# Patient Record
Sex: Male | Born: 1995 | Race: Black or African American | Hispanic: No | Marital: Single | Smoking: Never smoker
Health system: Southern US, Community
[De-identification: ages and names within clinical notes are randomized; demographics above are authoritative.]

## PROBLEM LIST (undated history)

## (undated) DIAGNOSIS — J45909 Unspecified asthma, uncomplicated: Secondary | ICD-10-CM

## (undated) DIAGNOSIS — L309 Dermatitis, unspecified: Secondary | ICD-10-CM

## (undated) HISTORY — DX: Dermatitis, unspecified: L30.9

## (undated) HISTORY — DX: Unspecified asthma, uncomplicated: J45.909

---
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
## 2017-06-11 ENCOUNTER — Emergency Department (HOSPITAL_BASED_OUTPATIENT_CLINIC_OR_DEPARTMENT_OTHER)
Admission: EM | Admit: 2017-06-11 | Discharge: 2017-06-12 | Disposition: A | Discharge status: Home / Self Care | Payer: Medicaid Other | Attending: Emergency Medicine | Admitting: Emergency Medicine

## 2017-06-11 ENCOUNTER — Encounter (HOSPITAL_BASED_OUTPATIENT_CLINIC_OR_DEPARTMENT_OTHER): Payer: Self-pay | Admitting: Emergency Medicine

## 2017-06-11 ENCOUNTER — Emergency Department (HOSPITAL_BASED_OUTPATIENT_CLINIC_OR_DEPARTMENT_OTHER): Payer: Medicaid Other

## 2017-06-11 DIAGNOSIS — R0789 Other chest pain: Secondary | ICD-10-CM | POA: Insufficient documentation

## 2017-06-11 DIAGNOSIS — R079 Chest pain, unspecified: Secondary | ICD-10-CM | POA: Diagnosis present

## 2017-06-11 MED ORDER — KETOROLAC TROMETHAMINE 15 MG/ML IJ SOLN
15.0000 mg | Freq: Once | INTRAMUSCULAR | Status: AC
  Administered 2017-06-11: 15 mg via INTRAMUSCULAR
  Filled 2017-06-11: qty 1

## 2017-06-11 MED ORDER — IPRATROPIUM-ALBUTEROL 0.5-2.5 (3) MG/3ML IN SOLN
3.0000 mL | RESPIRATORY_TRACT | Status: DC
  Administered 2017-06-11: 3 mL via RESPIRATORY_TRACT

## 2017-06-11 NOTE — ED Notes (Signed)
Alert, NAD, calm, interactive, resps e/u, speaking in clear complete sentences, no dyspnea noted, skin W&D, VSS, c/o L lower chest wall pain, worse with palpation, and movement, onset after doing push-ups, better at rest lying down, (denies: sob, NVD, syncope, dizziness or visual changes). EDP at [facility]. Family at [facility]

## 2017-06-11 NOTE — ED Triage Notes (Signed)
PT presents with c/o chest pain bilateral lateral for 2 weeks.

## 2017-06-11 NOTE — ED Provider Notes (Signed)
[facility] Provider Note: Andrew Dell, MD, FACEP  CSN: [id number] MRN: [id number] ARRIVAL: 06/11/17 at 2211 ROOM: MH11/MH11   CHIEF COMPLAINT  Chest Pain   HISTORY OF PRESENT ILLNESS  06/11/17 11:13 PM Andrew David is a 21 y.o. male with two-week history of chest pain. The pain began after performing pushups. He has continued to exercise since then. He states the pain is located in the upper substernal region and is worse when he takes a deep breath. He is also having pain in his lower anterior ribs which is worse with palpation or movement. He rates his pain as a 10 out of 10. He is equivocal about being short of breath. He has not been coughing. He has not had a fever or cold symptoms.   History reviewed. No pertinent past medical history.  History reviewed. No pertinent surgical history.  No family history on file.  Social History  Substance Use Topics  . Smoking status: Never Smoker  . Smokeless tobacco: Not on file  . Alcohol use No    Prior to Admission medications   Medication Sig Start Date End Date Taking? Authorizing Provider  naproxen (NAPROSYN) 375 MG tablet Take 1 tablet (375 mg total) by mouth 2 (two) times daily as needed (for chest wall pain). 06/12/17   Lexxie Winberg, Jonny Ruiz, MD    Allergies Patient has no known allergies.   REVIEW OF SYSTEMS  Negative except as noted here or in the History of Present Illness.   PHYSICAL EXAMINATION  Initial Vital Signs Blood pressure (!) 143/91, pulse 88, temperature 99.1 F (37.3 C), temperature source Oral, resp. rate 15, height 5' 6.5" (1.689 m), SpO2 100 %.  Examination General: Well-developed, well-nourished male in no acute distress; appearance consistent with age of record HENT: normocephalic; atraumatic Eyes: pupils equal, round and reactive to light; extraocular muscles intact Neck: supple Heart: regular rate and rhythm Lungs: Decreased air movement bilaterally without wheezing Chest: Mild  bilateral lower anterior rib tenderness Abdomen: soft; nondistended; nontender; no masses or hepatosplenomegaly; bowel sounds present Extremities: No deformity; full range of motion; pulses normal Neurologic: Awake, alert and oriented; motor function intact in all extremities and symmetric; no facial droop Skin: Warm and dry Psychiatric: Normal mood and affect   RESULTS  Summary of this visit's results, reviewed by myself:   EKG Interpretation  Date/Time:  Sunday June 11 2017 22:22:19 EDT Ventricular Rate:  85 PR Interval:  160 QRS Duration: 86 QT Interval:  340 QTC Calculation: 404 R Axis:   32 Text Interpretation:  Normal sinus rhythm with sinus arrhythmia Normal ECG No previous ECGs available Confirmed by Paula Libra ([id number]) on 06/11/2017 10:37:25 PM      Laboratory Studies: No results found for this or any previous visit (from the past 24 hour(s)). Imaging Studies: Dg Chest 2 View  Result Date: 06/11/2017 CLINICAL DATA:  Chest pain for 2 weeks EXAM: CHEST  2 VIEW COMPARISON:  None. FINDINGS: The heart size and mediastinal contours are within normal limits. Both lungs are clear. The visualized skeletal structures are unremarkable. IMPRESSION: No active cardiopulmonary disease. Electronically Signed   By: Deatra Robinson M.D.   On: 06/11/2017 22:44    ED COURSE  Nursing notes and initial vitals signs, including pulse oximetry, reviewed.  Vitals:   06/11/17 2318 06/11/17 2319 06/11/17 2330 06/11/17 2338  BP: 127/87  129/73   Pulse: 79 79 77   Resp:  10 18   Temp:      TempSrc:  SpO2: 98% 97% 97% 98%  Height:       12:13 AM No significant change in air movement with DuoNeb treatment. Suspect decreased air sounds due to poor inspiratory effort because of chest wall pain. Pain improved with IM Toradol.  PROCEDURES    ED DIAGNOSES     ICD-10-CM   1. Chest wall pain R07.89        Paula Libra, MD 06/12/17 [phone]

## 2017-06-12 MED ORDER — NAPROXEN 375 MG PO TABS: 375.0000 mg | ORAL_TABLET | Freq: Two times a day (BID) | ORAL | 0 refills | Status: DC | PRN

## 2017-06-12 NOTE — ED Notes (Signed)
Dr. Molpus at BS 

## 2018-05-05 ENCOUNTER — Encounter (HOSPITAL_COMMUNITY): Payer: Self-pay | Admitting: Emergency Medicine

## 2018-05-05 ENCOUNTER — Other Ambulatory Visit: Payer: Self-pay

## 2018-05-05 ENCOUNTER — Emergency Department (HOSPITAL_COMMUNITY)
Admission: EM | Admit: 2018-05-05 | Discharge: 2018-05-05 | Disposition: A | Discharge status: Home / Self Care | Payer: Medicaid Other | Attending: Emergency Medicine | Admitting: Emergency Medicine

## 2018-05-05 DIAGNOSIS — M549 Dorsalgia, unspecified: Secondary | ICD-10-CM

## 2018-05-05 MED ORDER — IBUPROFEN 600 MG PO TABS: 600.0000 mg | ORAL_TABLET | Freq: Three times a day (TID) | ORAL | 0 refills | Status: DC | PRN

## 2018-05-05 MED ORDER — CYCLOBENZAPRINE HCL 10 MG PO TABS: 10.0000 mg | ORAL_TABLET | Freq: Two times a day (BID) | ORAL | 0 refills | Status: DC | PRN

## 2018-05-05 NOTE — ED Notes (Signed)
No answer for triage X 1.

## 2018-05-05 NOTE — ED Provider Notes (Signed)
[facility]   CSN: [id number] Arrival date & time: 05/05/18  1719     History   Chief Complaint Chief Complaint  Patient presents with  . Back Pain    HPI Andrew David is a 22 y.o. male.  The history is provided by the patient. No language interpreter was used.  Back Pain       22 year old male presenting for evaluation of back pain.  Patient mention he has had recurrent back pain ongoing for a year.  His mattress recently became too old and he got rid of it, states that his bed also broke and now he is sleeping directly on the floor.  Due to that, he endorsed worsening mid back pain after laying.  Pain is sharp and achy throbbing nonradiating.  He also admits to having to perform heavy lifting boxes at work which aggravates the pain.  Pain is moderate in severity.  No associated fever, chills, productive cough, shortness of breath, chest pain, abdominal pain, or rash.  He takes ibuprofen at home on occasion with some relief.  Denies any bowel bladder incontinence or saddle anesthesia.  History reviewed. No pertinent past medical history.  There are no active problems to display for this patient.   No past surgical history on file.      Home Medications    Prior to Admission medications   Medication Sig Start Date End Date Taking? Authorizing Provider  naproxen (NAPROSYN) 375 MG tablet Take 1 tablet (375 mg total) by mouth 2 (two) times daily as needed (for chest wall pain). 06/12/17   Molpus, John, MD    Family History No family history on file.  Social History Social History   Tobacco Use  . Smoking status: Never Smoker  Substance Use Topics  . Alcohol use: No  . Drug use: No     Allergies   Patient has no known allergies.   Review of Systems Review of Systems  Musculoskeletal: Positive for back pain.  All other systems reviewed and are negative.    Physical Exam Updated Vital Signs BP 127/72  (BP Location: Right Arm)   Pulse 87   Temp 98.6 F (37 C) (Oral)   Resp 18   SpO2 100%   Physical Exam  Constitutional: He is oriented to person, place, and time. He appears well-developed and well-nourished. No distress.  HENT:  Head: Atraumatic.  Eyes: Conjunctivae are normal.  Neck: Neck supple.  Cardiovascular: Normal rate and regular rhythm.  Pulmonary/Chest: Effort normal and breath sounds normal.  Abdominal: Soft. There is no tenderness.  Musculoskeletal: He exhibits tenderness (Mild diffuse tenderness to thoracic and parathoracic region on palpation without any overlying skin changes crepitus emphysema or rash.).  Neurological: He is alert and oriented to person, place, and time.  Skin: No rash noted.  Psychiatric: He has a normal mood and affect.  Nursing note and vitals reviewed.    ED Treatments / Results  Labs (all labs ordered are listed, but only abnormal results are displayed) Labs Reviewed - No data to display  EKG None  Radiology No results found.  Procedures Procedures (including critical care time)  Medications Ordered in ED Medications - No data to display   Initial Impression / Assessment and Plan / ED Course  I have reviewed the triage vital signs and the nursing notes.  Pertinent labs & imaging results that were available during my care of the patient were reviewed by me and considered in  my medical decision making (see chart for details).     BP 127/72 (BP Location: Right Arm)   Pulse 87   Temp 98.6 F (37 C) (Oral)   Resp 18   SpO2 100%    Final Clinical Impressions(s) / ED Diagnoses   Final diagnoses:  Mid back pain    ED Discharge Orders         Ordered    ibuprofen (ADVIL,MOTRIN) 600 MG tablet  Every 8 hours PRN     05/05/18 1948    cyclobenzaprine (FLEXERIL) 10 MG tablet  2 times daily PRN     05/05/18 1948         7:49 PM Patient here with reproducible muscle skeletal back pain without any red flags.  No evidence  to suggest infection.  Will provide symptomatic treatment, rice therapy discussed.  Stable for discharge.   Fayrene Helper, PA-C 05/05/18 1949    Lockie Mola, Adam, DO 05/06/18 1104

## 2018-05-05 NOTE — ED Notes (Signed)
Patient able to ambulate independently  

## 2018-05-05 NOTE — ED Notes (Signed)
Registration at bedside.

## 2018-05-05 NOTE — ED Triage Notes (Signed)
Pt states he has mid back pain, he has been sleeping on the floor because his mattress broke and the spring had cut him several times. He has had back pain since high school that has gotten worse with work, lifting at work, and sleeping on the floor. Ambulatory. No loss of bladder function.

## 2018-05-20 ENCOUNTER — Other Ambulatory Visit: Payer: Self-pay

## 2018-05-20 ENCOUNTER — Emergency Department (HOSPITAL_COMMUNITY)
Admission: EM | Admit: 2018-05-20 | Discharge: 2018-05-20 | Disposition: A | Discharge status: Home / Self Care | Payer: Medicaid Other | Attending: Emergency Medicine | Admitting: Emergency Medicine

## 2018-05-20 ENCOUNTER — Encounter (HOSPITAL_COMMUNITY): Payer: Self-pay | Admitting: Emergency Medicine

## 2018-05-20 DIAGNOSIS — L299 Pruritus, unspecified: Secondary | ICD-10-CM | POA: Diagnosis present

## 2018-05-20 DIAGNOSIS — B86 Scabies: Secondary | ICD-10-CM | POA: Insufficient documentation

## 2018-05-20 MED ORDER — PERMETHRIN 5 % EX CREA: TOPICAL_CREAM | CUTANEOUS | 1 refills | Status: DC

## 2018-05-20 NOTE — ED Triage Notes (Signed)
Pt reports having itching that started 2 days ago after being in a new place. Pt denies any new detergents, lotions, or soaps. Pt reports itching all over.

## 2018-05-20 NOTE — ED Provider Notes (Signed)
[facility]-EMERGENCY DEPT Provider Note   CSN: [id number] Arrival date & time: 05/20/18  0058     History   Chief Complaint Chief Complaint  Patient presents with  . Pruritis    HPI Andrew David is a 22 y.o. male.  Patient presents to the emergency department with a chief complaint of itching.  He states that he knows that itching 2 days ago and then it started on his spreading.  He reports that he has some small bites or bumps on his upper arms.  He states that he is staying in a new place.  He denies seeing any bedbugs.  Denies any treatments.  The history is provided by the patient. No language interpreter was used.    History reviewed. No pertinent past medical history.  There are no active problems to display for this patient.   History reviewed. No pertinent surgical history.      Home Medications    Prior to Admission medications   Medication Sig Start Date End Date Taking? Authorizing Provider  cyclobenzaprine (FLEXERIL) 10 MG tablet Take 1 tablet (10 mg total) by mouth 2 (two) times daily as needed for muscle spasms. 05/05/18   Fayrene Helper, PA-C  ibuprofen (ADVIL,MOTRIN) 600 MG tablet Take 1 tablet (600 mg total) by mouth every 8 (eight) hours as needed for moderate pain. 05/05/18   Fayrene Helper, PA-C  naproxen (NAPROSYN) 375 MG tablet Take 1 tablet (375 mg total) by mouth 2 (two) times daily as needed (for chest wall pain). 06/12/17   Molpus, Jonny Ruiz, MD  permethrin (ELIMITE) 5 % cream Apply to entire body other than face - let sit for 14 hours then wash off, may repeat in 1 week if still having symptoms 05/20/18   Roxy Horseman, PA-C    Family History History reviewed. No pertinent family history.  Social History Social History   Tobacco Use  . Smoking status: Never Smoker  . Smokeless tobacco: Never Used  Substance Use Topics  . Alcohol use: No  . Drug use: No     Allergies   Patient has no known allergies.   Review of  Systems Review of Systems  All other systems reviewed and are negative.    Physical Exam Updated Vital Signs BP 119/81 (BP Location: Left Arm)   Pulse 68   Temp (!) 97.3 F (36.3 C)   Ht 5' 7.5" (1.715 m)   Wt 86.2 kg   SpO2 100%   BMI 29.32 kg/m   Physical Exam  Constitutional: He is oriented to person, place, and time. He appears well-developed and well-nourished.  HENT:  Head: Normocephalic and atraumatic.  Eyes: Pupils are equal, round, and reactive to light. Conjunctivae and EOM are normal. Right eye exhibits no discharge. Left eye exhibits no discharge. No scleral icterus.  Neck: Normal range of motion. Neck supple. No JVD present.  Cardiovascular: Normal rate, regular rhythm and normal heart sounds. Exam reveals no gallop and no friction rub.  No murmur heard. Pulmonary/Chest: Effort normal and breath sounds normal. No respiratory distress. He has no wheezes. He has no rales. He exhibits no tenderness.  Abdominal: Soft. He exhibits no distension and no mass. There is no tenderness. There is no rebound and no guarding.  Musculoskeletal: Normal range of motion. He exhibits no edema or tenderness.  Neurological: He is alert and oriented to person, place, and time.  Skin: Skin is warm and dry.  Bites on extremities  Psychiatric: He has a normal mood  and affect. His behavior is normal. Judgment and thought content normal.  Nursing note and vitals reviewed.    ED Treatments / Results  Labs (all labs ordered are listed, but only abnormal results are displayed) Labs Reviewed - No data to display  EKG None  Radiology No results found.  Procedures Procedures (including critical care time)  Medications Ordered in ED Medications - No data to display   Initial Impression / Assessment and Plan / ED Course  I have reviewed the triage vital signs and the nursing notes.  Pertinent labs & imaging results that were available during my care of the patient were reviewed by  me and considered in my medical decision making (see chart for details).       Final Clinical Impressions(s) / ED Diagnoses   Final diagnoses:  Pruritus  Scabies    ED Discharge Orders         Ordered    permethrin (ELIMITE) 5 % cream     05/20/18 0417           Roxy HorsemanBrowning, Hymen Arnett, PA-C 05/20/18 [id number]    Devoria AlbeKnapp, Iva, MD 05/20/18 [phone]

## 2018-06-06 ENCOUNTER — Other Ambulatory Visit: Payer: Self-pay

## 2018-06-06 ENCOUNTER — Encounter (HOSPITAL_BASED_OUTPATIENT_CLINIC_OR_DEPARTMENT_OTHER): Payer: Self-pay | Admitting: *Deleted

## 2018-06-06 ENCOUNTER — Emergency Department (HOSPITAL_BASED_OUTPATIENT_CLINIC_OR_DEPARTMENT_OTHER): Payer: No Typology Code available for payment source

## 2018-06-06 ENCOUNTER — Emergency Department (HOSPITAL_BASED_OUTPATIENT_CLINIC_OR_DEPARTMENT_OTHER)
Admission: EM | Admit: 2018-06-06 | Discharge: 2018-06-06 | Disposition: A | Discharge status: Home / Self Care | Payer: No Typology Code available for payment source | Attending: Emergency Medicine | Admitting: Emergency Medicine

## 2018-06-06 DIAGNOSIS — S99921A Unspecified injury of right foot, initial encounter: Secondary | ICD-10-CM | POA: Diagnosis present

## 2018-06-06 DIAGNOSIS — L03119 Cellulitis of unspecified part of limb: Secondary | ICD-10-CM

## 2018-06-06 DIAGNOSIS — S9031XA Contusion of right foot, initial encounter: Secondary | ICD-10-CM | POA: Diagnosis not present

## 2018-06-06 DIAGNOSIS — Y939 Activity, unspecified: Secondary | ICD-10-CM | POA: Insufficient documentation

## 2018-06-06 DIAGNOSIS — Y999 Unspecified external cause status: Secondary | ICD-10-CM | POA: Diagnosis not present

## 2018-06-06 DIAGNOSIS — Y929 Unspecified place or not applicable: Secondary | ICD-10-CM | POA: Diagnosis not present

## 2018-06-06 DIAGNOSIS — W228XXA Striking against or struck by other objects, initial encounter: Secondary | ICD-10-CM | POA: Diagnosis not present

## 2018-06-06 MED ORDER — CEPHALEXIN 500 MG PO CAPS: 500.0000 mg | ORAL_CAPSULE | Freq: Three times a day (TID) | ORAL | 0 refills | Status: DC

## 2018-06-06 NOTE — ED Triage Notes (Signed)
Pt c/o right foot injury at work today

## 2018-06-06 NOTE — ED Notes (Signed)
Pt verbalizes understanding of d/c instructions and denies any further needs at this time. 

## 2018-06-06 NOTE — ED Notes (Signed)
Pt c/o right foot pain after dropping a box on it yesterday at work

## 2018-06-15 NOTE — ED Provider Notes (Signed)
[facility] EMERGENCY DEPARTMENT Provider Note   CSN: [id number] Arrival date & time: 06/06/18  2101     History   Chief Complaint Chief Complaint  Patient presents with  . Foot Injury    HPI Andrew David is a 22 y.o. male.  HPI   22 year old male with right foot pain.  He states he dropped a heavy box on earlier today.  He has had persistent pain since.  He can bear weight although with increased pain.  Denies any other injury.  No intervention prior to arrival.  History reviewed. No pertinent past medical history.  There are no active problems to display for this patient.   History reviewed. No pertinent surgical history.      Home Medications    Prior to Admission medications   Medication Sig Start Date End Date Taking? Authorizing Provider  cephALEXin (KEFLEX) 500 MG capsule Take 1 capsule (500 mg total) by mouth 3 (three) times daily. 06/06/18   Raeford Razor, MD  cyclobenzaprine (FLEXERIL) 10 MG tablet Take 1 tablet (10 mg total) by mouth 2 (two) times daily as needed for muscle spasms. 05/05/18   Fayrene Helper, PA-C  ibuprofen (ADVIL,MOTRIN) 600 MG tablet Take 1 tablet (600 mg total) by mouth every 8 (eight) hours as needed for moderate pain. 05/05/18   Fayrene Helper, PA-C  naproxen (NAPROSYN) 375 MG tablet Take 1 tablet (375 mg total) by mouth 2 (two) times daily as needed (for chest wall pain). 06/12/17   Molpus, Jonny Ruiz, MD  permethrin (ELIMITE) 5 % cream Apply to entire body other than face - let sit for 14 hours then wash off, may repeat in 1 week if still having symptoms 05/20/18   Roxy Horseman, PA-C    Family History History reviewed. No pertinent family history.  Social History Social History   Tobacco Use  . Smoking status: Never Smoker  . Smokeless tobacco: Never Used  Substance Use Topics  . Alcohol use: No  . Drug use: No     Allergies   Patient has no known allergies.   Review of Systems Review of Systems  All systems reviewed  and negative, other than as noted in HPI.  Physical Exam Updated Vital Signs BP 129/68   Pulse 83   Temp 98.5 F (36.9 C) (Oral)   Resp 16   Ht 5\' 7"  (1.702 m)   Wt 86 kg   SpO2 100%   BMI 29.69 kg/m   Physical Exam  Constitutional: He appears well-developed and well-nourished. No distress.  HENT:  Head: Normocephalic and atraumatic.  Eyes: Conjunctivae are normal. Right eye exhibits no discharge. Left eye exhibits no discharge.  Neck: Neck supple.  Cardiovascular: Normal rate, regular rhythm and normal heart sounds. Exam reveals no gallop and no friction rub.  No murmur heard. Pulmonary/Chest: Effort normal and breath sounds normal. No respiratory distress.  Abdominal: Soft. He exhibits no distension. There is no tenderness.  Musculoskeletal: He exhibits no edema or tenderness.  Tenderness to palpation of the mid aspect of the dorsal right foot.  No concerning skin changes.  Neuro vascularly intact.  Neurological: He is alert.  Skin: Skin is warm and dry.  Psychiatric: He has a normal mood and affect. His behavior is normal. Thought content normal.  Nursing note and vitals reviewed.    ED Treatments / Results  Labs (all labs ordered are listed, but only abnormal results are displayed) Labs Reviewed - No data to display  EKG None  Radiology No  results found.   Dg Foot Complete Right  Result Date: 06/06/2018 CLINICAL DATA:  Dropped a box on foot, dorsal pain EXAM: RIGHT FOOT COMPLETE - 3+ VIEW COMPARISON:  None. FINDINGS: There is no evidence of fracture or dislocation. There is no evidence of arthropathy or other focal bone abnormality. Soft tissues are unremarkable. IMPRESSION: Negative. Electronically Signed   By: Jasmine Pang M.D.   On: 06/06/2018 21:42    Procedures Procedures (including critical care time)  Medications Ordered in ED Medications - No data to display   Initial Impression / Assessment and Plan / ED Course  I have reviewed the triage vital  signs and the nursing notes.  Pertinent labs & imaging results that were available during my care of the patient were reviewed by me and considered in my medical decision making (see chart for details).     22 year old male with right foot pain after dropping a large box on it.  Likely contusion.  Negative imaging.  Final Clinical Impressions(s) / ED Diagnoses   Final diagnoses:  Contusion of right foot, initial encounter  Cellulitis of hand    ED Discharge Orders         Ordered    cephALEXin (KEFLEX) 500 MG capsule  3 times daily     06/06/18 2304           Raeford Razor, MD 06/16/18 0000

## 2019-05-07 ENCOUNTER — Other Ambulatory Visit: Payer: Self-pay

## 2019-05-07 ENCOUNTER — Ambulatory Visit: Payer: Self-pay

## 2019-05-07 ENCOUNTER — Other Ambulatory Visit: Payer: Self-pay | Admitting: Family Medicine

## 2019-05-07 DIAGNOSIS — M79644 Pain in right finger(s): Secondary | ICD-10-CM

## 2019-07-02 ENCOUNTER — Ambulatory Visit: Payer: Self-pay | Admitting: *Deleted

## 2019-07-02 NOTE — Telephone Encounter (Signed)
Pt called in saying he thinks he was exposed to someone who was positive for COVID-19 like a week or so ago. He wanted information about testing.   I let him know he did not need an appt or doctor's order to be tested.   I gave him directions to the [facility] testing site in [place].  If he had been on quarantine this week would have been his 14 days.   He could not remember exactly  When he was exposed.  I answered his questions.

## 2019-08-28 ENCOUNTER — Ambulatory Visit: Payer: Medicaid Other | Admitting: Allergy and Immunology

## 2019-09-04 ENCOUNTER — Ambulatory Visit (INDEPENDENT_AMBULATORY_CARE_PROVIDER_SITE_OTHER): Payer: BC Managed Care – PPO | Admitting: Allergy

## 2019-09-04 ENCOUNTER — Encounter: Payer: Self-pay | Admitting: Allergy

## 2019-09-04 ENCOUNTER — Ambulatory Visit: Payer: Medicaid Other | Admitting: Allergy and Immunology

## 2019-09-04 ENCOUNTER — Other Ambulatory Visit: Payer: Self-pay

## 2019-09-04 VITALS — BP 118/80 | HR 85 | Temp 97.7°F | Ht 67.0 in | Wt 180.6 lb

## 2019-09-04 DIAGNOSIS — Z8709 Personal history of other diseases of the respiratory system: Secondary | ICD-10-CM | POA: Diagnosis not present

## 2019-09-04 DIAGNOSIS — H6123 Impacted cerumen, bilateral: Secondary | ICD-10-CM | POA: Diagnosis not present

## 2019-09-04 DIAGNOSIS — R067 Sneezing: Secondary | ICD-10-CM | POA: Diagnosis not present

## 2019-09-04 DIAGNOSIS — J310 Chronic rhinitis: Secondary | ICD-10-CM | POA: Diagnosis not present

## 2019-09-04 NOTE — Progress Notes (Signed)
New Patient Note  RE: Andrew David MRN: [id number] DOB: 1995-12-22 Date of Office Visit: 09/04/2019  Referring provider: Andrey Cota, * Primary care provider: Andrey Cota, MD  Chief Complaint: Allergic Rhinitis  (ears itch)  History of Present Illness: I had the pleasure of seeing Andrew David for initial evaluation at the [facility] on 09/04/2019. He is a 23 y.o. male, who is referred here by Andrey Cota, MD for the evaluation of allergic rhinitis and asthma.  Rhinitis: He reports symptoms of sneezing and itchy ears. He had rhinitis symptoms for awhile but the itchy ears is only for the past 2 weeks. Other triggers include exposure to dust. Anosmia: no. Headache: no. He has used OTC nasal spray with unknown benefit. Sinus infections: no. Previous work up includes: patient had skin prick testing done in the past (maybe 1-2 ears ago?) which was positive to dust mites, pollen, mildew per patient report. No previous allergy injections.  Previous ENT evaluation: no. Previous sinus imaging: no. History of nasal polyps: no. Last eye exam: not sure. History of reflux: no.  Asthma as a child. He reports symptoms of chest tightness, shortness of breath, coughing, wheezing for 1 year as a child. Current medications include none. He reports not using aerochamber with inhalers. He tried the following inhalers: albuterol prn. Main triggers are exercise. In the last month, frequency of symptoms: 0x/week. Frequency of nocturnal symptoms: 0x/month. Frequency of SABA use: 0x/week. Interference with physical activity: no. Sleep is undisturbed. In the last 12 months, emergency room visits/urgent care visits/doctor office visits or hospitalizations due to respiratory issues: 0. In the last 12 months, oral steroids courses: 0. Lifetime history of hospitalization for respiratory issues: no. Prior intubations: no. History of pneumonia: no. He was evaluated by  allergist in the past. Smoking exposure: no. Up to date with flu vaccine: no.   Assessment and Plan: Gleen is a 23 y.o. male with: Chronic rhinitis Rhinitis symptoms with sneezing and itchy ears the past 2 weeks. Triggers include dust exposure at work. Skin testing in the past was positive to dust mites, pollen and mildew per patient report. No previous allergy injections.   Today's skin prick testing was negative to indoor and outdoor allergens. Declined intradermals as symptoms not bothersome enough.   Monitor symptoms.   May use nasal saline spray (i.e., Simply Saline) as needed if around too much dust a sit may be an irritant causing his sneezing.  Bilateral impacted cerumen Itchy ears may be secondary to cerumen. Removed cerumen from right ear and left ear with no issues/trauma.   History of asthma Asthma as a child with exertion. No inhaler use for more than 1 year.   Today's spirometry showed mild restriction with no improvement in FEV1 post bronchodilator treatment and clinically feeling unchanged.  Monitor symptoms. No need for inhaler at this time.   Return if symptoms worsen or fail to improve.  Other allergy screening: Food allergy: no Medication allergy: no Hymenoptera allergy: no Urticaria: no Eczema: yes as a child.  History of recurrent infections suggestive of immunodeficency: no  Diagnostics: Spirometry:  Tracings reviewed. His effort: It was hard to get consistent efforts and there is a question as to whether this reflects a maximal maneuver. FVC: 3.90L FEV1: 3.22L, 76% predicted FEV1/FVC ratio: 83% Interpretation: Spirometry consistent with possible restrictive disease and no improvement in FEV1 post bronchodilator treatment.  Please see scanned spirometry results for details.  Skin Testing: Environmental allergy panel. Negative test to:  environmental panel.  Results discussed with patient/family. Airborne Adult Perc - 09/04/19 1455    Time Antigen  Placed  1455    Allergen Manufacturer  Waynette Buttery    Location  Back    Number of Test  59    Panel 1  Select    1. Control-Buffer 50% Glycerol  Negative    2. Control-Histamine 1 mg/ml  2+    3. Albumin saline  Negative    4. Bahia  Negative    5. French Southern Territories  Negative    6. Johnson  Negative    7. Kentucky Blue  Negative    8. Meadow Fescue  Negative    9. Perennial Rye  Negative    10. Sweet Vernal  Negative    11. Timothy  Negative    12. Cocklebur  Negative    13. Burweed Marshelder  Negative    14. Ragweed, short  Negative    15. Ragweed, Giant  Negative    16. Plantain,  English  Negative    17. Lamb's Quarters  Negative    18. Sheep Sorrell  Negative    19. Rough Pigweed  Negative    20. Marsh Elder, Rough  Negative    21. Mugwort, Common  Negative    22. Ash mix  Negative    23. Birch mix  Negative    24. Beech American  Negative    25. Box, Elder  Negative    26. Cedar, red  Negative    27. Cottonwood, Guinea-Bissau  Negative    28. Elm mix  Negative    29. Hickory mix  Negative    30. Maple mix  Negative    31. Oak, Guinea-Bissau mix  Negative    32. Pecan Pollen  Negative    33. Pine mix  Negative    34. Sycamore Eastern  Negative    35. Walnut, Black Pollen  Negative    36. Alternaria alternata  Negative    37. Cladosporium Herbarum  Negative    38. Aspergillus mix  Negative    39. Penicillium mix  Negative    40. Bipolaris sorokiniana (Helminthosporium)  Negative    41. Drechslera spicifera (Curvularia)  Negative    42. Mucor plumbeus  Negative    43. Fusarium moniliforme  Negative    44. Aureobasidium pullulans (pullulara)  Negative    45. Rhizopus oryzae  Negative    46. Botrytis cinera  Negative    47. Epicoccum nigrum  Negative    48. Phoma betae  Negative    49. Candida Albicans  Negative    50. Trichophyton mentagrophytes  Negative    51. Mite, D Farinae  5,000 AU/ml  Negative    52. Mite, D Pteronyssinus  5,000 AU/ml  Negative    53. Cat Hair 10,000 BAU/ml   Negative    54.  Dog Epithelia  Negative    55. Mixed Feathers  Negative    56. Horse Epithelia  Negative    57. Cockroach, German  Negative    58. Mouse  Negative    59. Tobacco Leaf  Negative       Past Medical History: Patient Active Problem List   Diagnosis Date Noted  . Chronic rhinitis 09/04/2019  . Sneezing 09/04/2019  . History of asthma 09/04/2019  . Bilateral impacted cerumen 09/04/2019   Past Medical History:  Diagnosis Date  . Asthma   . Eczema    Past Surgical History: History reviewed. No  pertinent surgical history. Medication List:  No current outpatient medications on file.   No current facility-administered medications for this visit.   Allergies: No Known Allergies Social History: Social History   Socioeconomic History  . Marital status: Single    Spouse name: Not on file  . Number of children: Not on file  . Years of education: Not on file  . Highest education level: Not on file  Occupational History  . Not on file  Tobacco Use  . Smoking status: Never Smoker  . Smokeless tobacco: Never Used  Substance and Sexual Activity  . Alcohol use: No  . Drug use: No  . Sexual activity: Not on file  Other Topics Concern  . Not on file  Social History Narrative  . Not on file   Social Determinants of Health   Financial Resource Strain:   . Difficulty of Paying Living Expenses: Not on file  Food Insecurity:   . Worried About Charity fundraiser in the Last Year: Not on file  . Ran Out of Food in the Last Year: Not on file  Transportation Needs:   . Lack of Transportation (Medical): Not on file  . Lack of Transportation (Non-Medical): Not on file  Physical Activity:   . Days of Exercise per Week: Not on file  . Minutes of Exercise per Session: Not on file  Stress:   . Feeling of Stress : Not on file  Social Connections:   . Frequency of Communication with Friends and Family: Not on file  . Frequency of Social Gatherings with Friends and  Family: Not on file  . Attends Religious Services: Not on file  . Active Member of Clubs or Organizations: Not on file  . Attends Archivist Meetings: Not on file  . Marital Status: Not on file   Lives in a [address] year old townhome. Smoking: denies Occupation: [employer]  Environmental HistoryFreight forwarder in the house: no Carpet in the family room: no Carpet in the bedroom: yes Heating: gas and electric Cooling: central Pet: no  Family History: Family History  Problem Relation Age of Onset  . Hypertension Mother   . Healthy Father    Problem                               Relation Allergic rhino conjunctivitis     Brother   Review of Systems  Constitutional: Negative for appetite change, chills, fever and unexpected weight change.  HENT: Positive for sneezing. Negative for congestion and rhinorrhea.        Ear itching  Eyes: Negative for itching.  Respiratory: Negative for cough, chest tightness, shortness of breath and wheezing.   Cardiovascular: Negative for chest pain.  Gastrointestinal: Negative for abdominal pain.  Genitourinary: Negative for difficulty urinating.  Skin: Negative for rash.  Allergic/Immunologic: Negative for environmental allergies and food allergies.  Neurological: Negative for headaches.   Objective: BP 118/80   Pulse 85   Temp 97.7 F (36.5 C) (Temporal)   Ht 5\' 7"  (1.702 m)   Wt 180 lb 9.6 oz (81.9 kg)   SpO2 98%   BMI 28.29 kg/m  Body mass index is 28.29 kg/m. Physical Exam  Constitutional: He is oriented to person, place, and time. He appears well-developed and well-nourished.  HENT:  Head: Normocephalic and atraumatic.  Right Ear: External ear normal.  Left Ear: External ear normal.  Nose: Mucosal edema (on right side)  present.  Mouth/Throat: Oropharynx is clear and moist.  Bilateral cerumen  Eyes: Conjunctivae and EOM are normal.  Cardiovascular: Normal rate, regular rhythm and normal heart sounds. Exam reveals no  gallop and no friction rub.  No murmur heard. Pulmonary/Chest: Effort normal and breath sounds normal. He has no wheezes. He has no rales.  Abdominal: Soft.  Musculoskeletal:     Cervical back: Neck supple.  Neurological: He is alert and oriented to person, place, and time.  Skin: Skin is warm. No rash noted.  Psychiatric: He has a normal mood and affect. His behavior is normal.  Nursing note and vitals reviewed.  The plan was reviewed with the patient/family, and all questions/concerned were addressed.  It was my pleasure to see Rosalyn Charterskuan today and participate in his care. Please feel free to contact me with any questions or concerns.  Sincerely,  Wyline MoodYoon Inaki Vantine, DO Allergy & Immunology  [facility]  [place] office: [phone] [facility] office: [phone] [place] office: [phone]

## 2019-09-04 NOTE — Assessment & Plan Note (Signed)
Asthma as a child with exertion. No inhaler use for more than 1 year.   Today's spirometry showed mild restriction with no improvement in FEV1 post bronchodilator treatment and clinically feeling unchanged.  Monitor symptoms. No need for inhaler at this time.

## 2019-09-04 NOTE — Assessment & Plan Note (Signed)
Itchy ears may be secondary to cerumen. Removed cerumen from right ear and left ear with no issues/trauma.

## 2019-09-04 NOTE — Assessment & Plan Note (Addendum)
Rhinitis symptoms with sneezing and itchy ears the past 2 weeks. Triggers include dust exposure at work. Skin testing in the past was positive to dust mites, pollen and mildew per patient report. No previous allergy injections.   Today's skin prick testing was negative to indoor and outdoor allergens. Declined intradermals as symptoms not bothersome enough.   Monitor symptoms.   May use nasal saline spray (i.e., Simply Saline) as needed if around too much dust a sit may be an irritant causing his sneezing.

## 2019-09-04 NOTE — Patient Instructions (Addendum)
Today's skin testing was negative to indoor and outdoor allergens.   Monitor symptoms.   May use nasal saline spray (i.e., Simply Saline) as needed if around too much dust.  Follow up as needed.

## 2020-12-26 IMAGING — DX DG FINGER THUMB 2+V*R*
3 series · 3 of 3 positions shown · non-contrast
Comparison: Right thumb radiographs 02/08/2014

CLINICAL DATA: Hyperextended right thumb 05/06/2019; persistent pain
right thumb/Samudio

EXAM:
RIGHT THUMB 2+V

[finger pa]
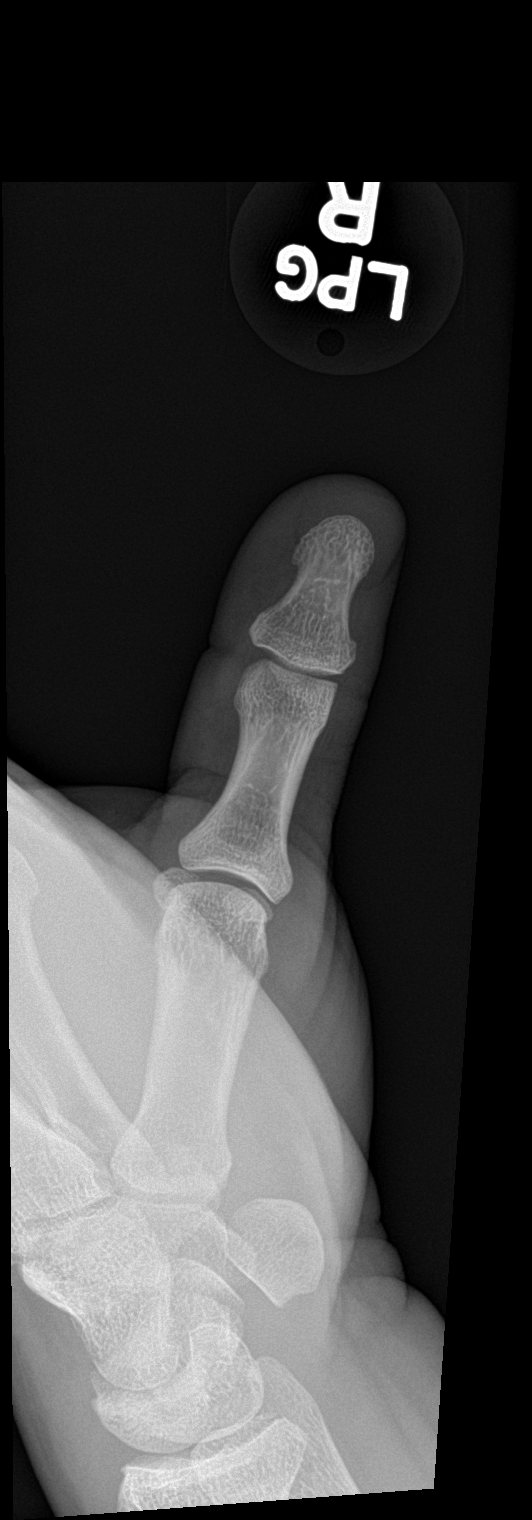

[finger obl]
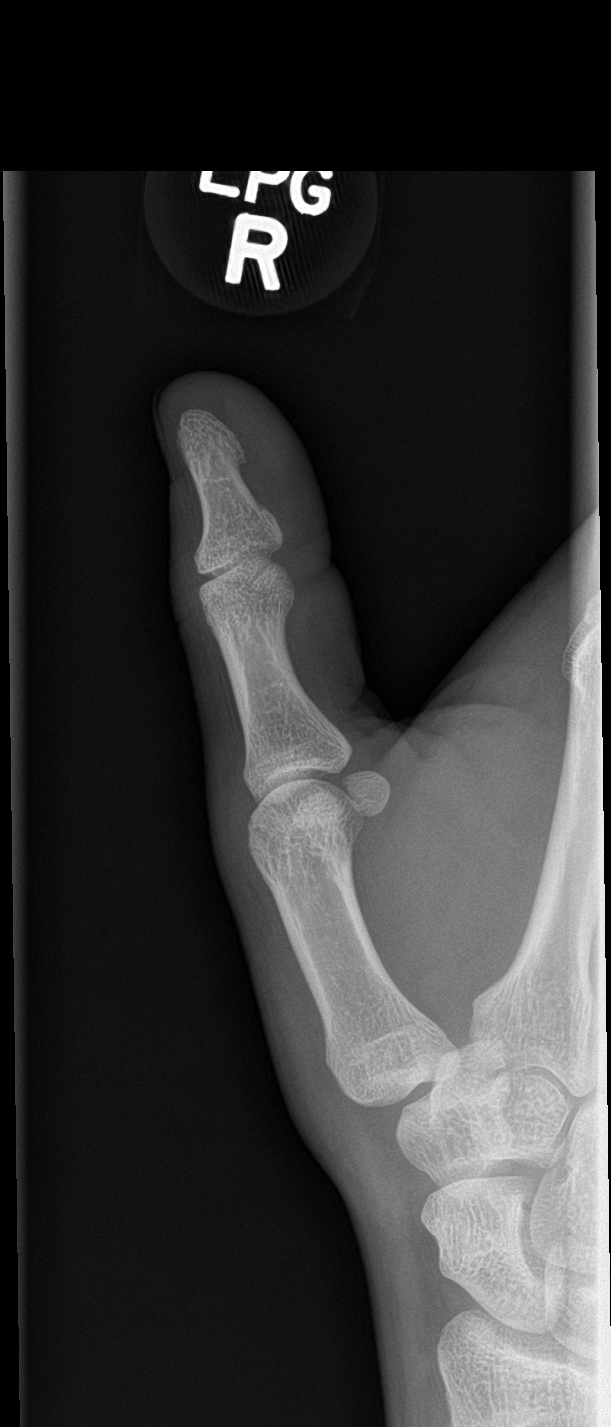

[finger lat]
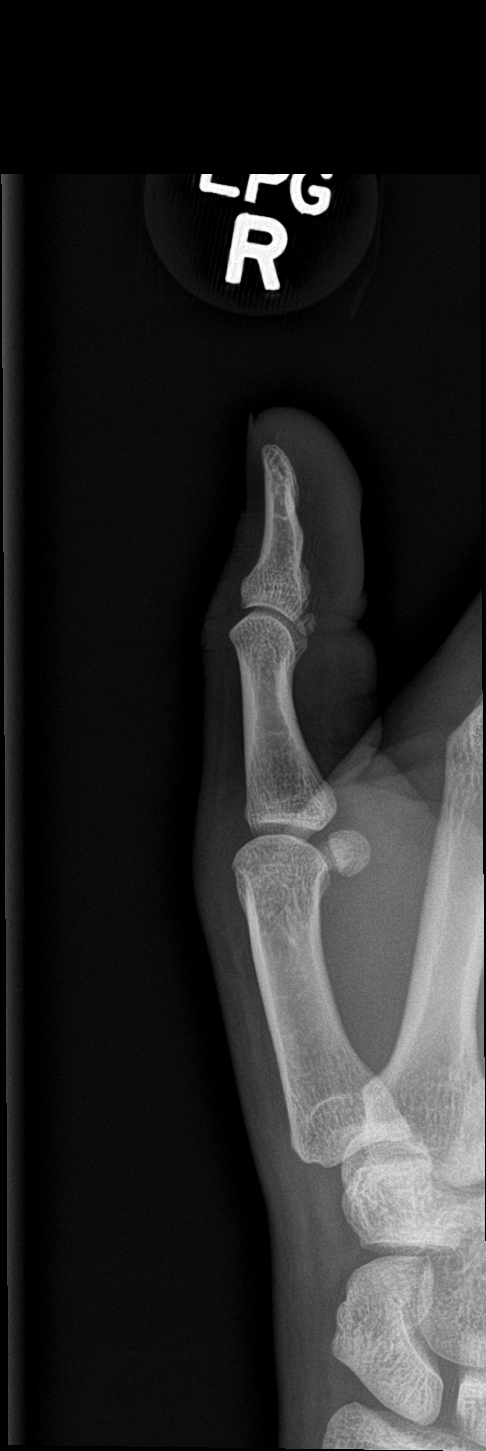

[3 of 3 positions shown; findings below may reference images not displayed]

FINDINGS: There is no evidence of fracture or dislocation. There is no
evidence of arthropathy or other focal bone abnormality. Soft
tissues are unremarkable.
IMPRESSION: No acute osseous abnormality in the right thumb.

## 2023-12-24 ENCOUNTER — Emergency Department (HOSPITAL_BASED_OUTPATIENT_CLINIC_OR_DEPARTMENT_OTHER)
Admission: EM | Admit: 2023-12-24 | Discharge: 2023-12-24 | Disposition: A | Discharge status: Home / Self Care | Attending: Emergency Medicine | Admitting: Emergency Medicine

## 2023-12-24 ENCOUNTER — Encounter (HOSPITAL_BASED_OUTPATIENT_CLINIC_OR_DEPARTMENT_OTHER): Payer: Self-pay | Admitting: Emergency Medicine

## 2023-12-24 DIAGNOSIS — J45909 Unspecified asthma, uncomplicated: Secondary | ICD-10-CM | POA: Insufficient documentation

## 2023-12-24 DIAGNOSIS — J029 Acute pharyngitis, unspecified: Secondary | ICD-10-CM | POA: Diagnosis present

## 2023-12-24 DIAGNOSIS — J020 Streptococcal pharyngitis: Secondary | ICD-10-CM | POA: Diagnosis not present

## 2023-12-24 LAB — GROUP A STREP BY PCR: Group A Strep by PCR: DETECTED — AB

## 2023-12-24 MED ORDER — DEXAMETHASONE SODIUM PHOSPHATE 10 MG/ML IJ SOLN
10.0000 mg | Freq: Once | INTRAMUSCULAR | Status: AC
  Administered 2023-12-24: 10 mg via INTRAMUSCULAR
  Filled 2023-12-24: qty 1

## 2023-12-24 MED ORDER — PENICILLIN G BENZATHINE 1200000 UNIT/2ML IM SUSY
1.2000 10*6.[IU] | PREFILLED_SYRINGE | Freq: Once | INTRAMUSCULAR | Status: AC
  Administered 2023-12-24: 1.2 10*6.[IU] via INTRAMUSCULAR
  Filled 2023-12-24: qty 2

## 2023-12-24 MED ORDER — LIDOCAINE VISCOUS HCL 2 % MT SOLN
15.0000 mL | Freq: Once | OROMUCOSAL | Status: AC
  Administered 2023-12-24: 15 mL via OROMUCOSAL
  Filled 2023-12-24: qty 15

## 2023-12-24 NOTE — ED Notes (Signed)
 Pt alert and oriented X 4 at the time of discharge. RR even and unlabored. No acute distress noted. Pt verbalized understanding of discharge instructions as discussed. Pt ambulatory to lobby at time of discharge.

## 2023-12-24 NOTE — ED Provider Notes (Signed)
 [place] EMERGENCY DEPARTMENT AT [facility] Provider Note   CSN: [id number] Arrival date & time: 12/24/23  1446     History  Chief Complaint  Patient presents with   Sore Throat    Andrew David is a 28 y.o. male history of asthma and eczema presented for a few days of sore throat.  Patient is able to eat and drink without issue but states that last night he spit out a few stones.  Patient states his tonsils are inflamed.  Patient denies any fevers, neck rigidity, altered mental status, ear pain, headache, chest pain or shortness of breath.  Patient denies any facial or neck swelling.  Patient is able to breathe without issue.    Home Medications Prior to Admission medications   Not on File      Allergies    Patient has no known allergies.    Review of Systems   Review of Systems  Physical Exam Updated Vital Signs BP 132/82 (BP Location: Right Arm)   Pulse (!) 102   Temp 99.1 F (37.3 C) (Oral)   Resp 14   Ht 5\' 7"  (1.702 m)   Wt 104.3 kg   SpO2 97%   BMI 36.02 kg/m  Physical Exam Constitutional:      General: He is not in acute distress. HENT:     Head: Normocephalic and atraumatic.     Jaw: There is normal jaw occlusion.     Salivary Glands: Right salivary gland is not diffusely enlarged or tender. Left salivary gland is not diffusely enlarged or tender.     Comments: No facial swelling    Right Ear: Hearing, tympanic membrane, ear canal and external ear normal.     Left Ear: Hearing, tympanic membrane, ear canal and external ear normal.     Nose: Nose normal.     Mouth/Throat:     Lips: Pink.     Mouth: Mucous membranes are moist.     Pharynx: Oropharynx is clear. Uvula midline. Posterior oropharyngeal erythema present. No oropharyngeal exudate.     Comments: No oral floor swelling Tolerating secretions No purulent drainage no No PTA noted No muffled voice noted 2+ bilateral tonsils Eyes:     Extraocular Movements: Extraocular movements  intact.     Conjunctiva/sclera: Conjunctivae normal.     Pupils: Pupils are equal, round, and reactive to light.  Neck:     Comments: No neck swelling Cardiovascular:     Rate and Rhythm: Normal rate and regular rhythm.     Pulses: Normal pulses.     Heart sounds: Normal heart sounds.  Pulmonary:     Effort: Pulmonary effort is normal. No respiratory distress.     Breath sounds: Normal breath sounds.  Musculoskeletal:     Cervical back: Normal range of motion and neck supple. No rigidity or tenderness.  Lymphadenopathy:     Cervical: No cervical adenopathy.  Neurological:     Mental Status: He is alert.     ED Results / Procedures / Treatments   Labs (all labs ordered are listed, but only abnormal results are displayed) Labs Reviewed  GROUP A STREP BY PCR - Abnormal; Notable for the following components:      Result Value   Group A Strep by PCR DETECTED (*)    All other components within normal limits    EKG None  Radiology No results found.  Procedures Procedures    Medications Ordered in ED Medications  penicillin  g benzathine (BICILLIN   LA) 1200000 UNIT/2ML injection 1.2 Million Units (has no administration in time range)  dexamethasone  (DECADRON ) injection 10 mg (has no administration in time range)  lidocaine  (XYLOCAINE ) 2 % viscous mouth solution 15 mL (has no administration in time range)    ED Course/ Medical Decision Making/ A&P                                 Medical Decision Making Risk Prescription drug management.   Andrew David 28 y.o. presented today for sore throat.  Working DDx that I considered at this time includes, but not limited to, viral, strep pharyngitis, mononucleosis, coxsackie, Kawasaki, angioedema, SJS, RPA, PTA, Ludwig's angina, epiglottitis, thrush, parotiditis, acute necrotizing ulcerative gingivitis, sinusitis, inhaled irritants.  R/o DDx: viral, mononucleosis, coxsackie, Kawasaki, angioedema, SJS, RPA, PTA, Ludwig's angina,  epiglottitis, thrush, parotiditis, acute necrotizing ulcerative gingivitis, sinusitis, inhaled irritants: These are considered less likely due to history of present illness, physical exam, labs/imaging findings  Review of prior external notes: 07/21/2020  Unique Tests and My Independent Interpretation:  Strep: Positive  Social Determinants of Health: none  Discussion with Independent Historian: None  Discussion of Management of Tests: None  Risk: Medium: prescription drug management  Risk Stratification Score: None  Plan: On exam patient was no acute distress with stable vitals.  Exam does show erythematous tonsils that are 2+ bilaterally but otherwise no other abnormalities.  Patient is sitting resting comfortably and not in acute distress with stable vitals.  Strep test from triage was positive so we will treat with penicillin  along with Decadron  to help with the swelling and give viscous lidocaine  to help numb up patient's throat for comfort.  Will have him follow-up with his primary care provider.  Did recommend Tylenol or ibuprofen  every 6 hours needed for pain in the meantime.  Patient was given return precautions. Patient stable for discharge at this time.  Patient verbalized understanding of plan.  This chart was dictated using voice recognition software.  Despite best efforts to proofread,  errors can occur which can change the documentation meaning.        Final Clinical Impression(s) / ED Diagnoses Final diagnoses:  Strep pharyngitis    Rx / DC Orders ED Discharge Orders     None         Elex Grimmer 12/24/23 1533    Arvilla Birmingham, MD 12/24/23 [phone]

## 2023-12-24 NOTE — ED Triage Notes (Signed)
 Pt c/o "tonsils swollen" and hurts to swallow; sts he spit out 3 tonsil stones last night

## 2023-12-24 NOTE — Discharge Instructions (Addendum)
 Discharge Instructions: You have been diagnosed with strep throat and received an intramuscular injection of penicillin . This treatment is effective in eradicating the infection and preventing complications such as rheumatic fever. Here are your discharge instructions:  Medications:  You received a single injection of Bicillin  C-R (penicillin  G benzathine and penicillin  G procaine) to treat your infection. This medication will continue to work in your body over the next few days.  Follow-Up:  Schedule a follow-up appointment with your primary care physician in 7-10 days to ensure the infection has resolved.  If you experience persistent or worsening symptoms after 5 days of treatment, contact your healthcare provider for reevaluation.  Red Flag Symptoms:  Seek immediate medical attention if you experience any of the following symptoms:  Difficulty breathing or swallowing  Severe headache or neck stiffness  High fever (above 102F) that does not respond to medication  Swelling of the face or neck  Rash or hives, which may indicate an allergic reaction  Pale, gray, or blue-colored skin (cyanosis), rapid heart rate, shortness of breath, lightheadedness, or fatigue, which may indicate methemoglobinemia.  General Care:  Rest and stay hydrated. Drink plenty of fluids to keep your throat moist and to prevent dehydration.  Use over-the-counter pain relievers such as acetaminophen or ibuprofen  to manage pain and fever. Avoid aspirin in children and adolescents due to the risk of Reye's syndrome.  Gargle with warm salt water and use throat lozenges to soothe your throat.  Prevention:  To prevent spreading the infection, avoid close contact with others until you have been on antibiotics for at least 24 hours and your symptoms have improved.  Practice good hand hygiene by washing your hands frequently with soap and water.

## 2024-03-07 ENCOUNTER — Encounter (HOSPITAL_BASED_OUTPATIENT_CLINIC_OR_DEPARTMENT_OTHER): Payer: Self-pay | Admitting: Emergency Medicine

## 2024-03-07 ENCOUNTER — Emergency Department (HOSPITAL_BASED_OUTPATIENT_CLINIC_OR_DEPARTMENT_OTHER)
Admission: EM | Admit: 2024-03-07 | Discharge: 2024-03-07 | Disposition: A | Discharge status: Home / Self Care | Attending: Emergency Medicine | Admitting: Emergency Medicine

## 2024-03-07 ENCOUNTER — Other Ambulatory Visit: Payer: Self-pay

## 2024-03-07 DIAGNOSIS — L03032 Cellulitis of left toe: Secondary | ICD-10-CM | POA: Insufficient documentation

## 2024-03-07 DIAGNOSIS — M79675 Pain in left toe(s): Secondary | ICD-10-CM | POA: Diagnosis present

## 2024-03-07 MED ORDER — CEPHALEXIN 250 MG PO CAPS
500.0000 mg | ORAL_CAPSULE | Freq: Once | ORAL | Status: AC
  Administered 2024-03-07: 500 mg via ORAL
  Filled 2024-03-07: qty 2

## 2024-03-07 MED ORDER — CEPHALEXIN 500 MG PO CAPS: 500.0000 mg | ORAL_CAPSULE | Freq: Three times a day (TID) | ORAL | 0 refills | Status: AC

## 2024-03-07 MED ORDER — LIDOCAINE HCL (PF) 1 % IJ SOLN
5.0000 mL | Freq: Once | INTRAMUSCULAR | Status: AC
  Administered 2024-03-07: 5 mL
  Filled 2024-03-07: qty 5

## 2024-03-07 NOTE — Discharge Instructions (Addendum)
 Warm epsom salt water soaks for 20 minutes 3 times daily. Antibiotics as prescribed. Follow up with podiatry, call to schedule an appointment.

## 2024-03-07 NOTE — ED Triage Notes (Signed)
 Pt POV c/o possible ingrown toenail of great toe on L foot.  Reports swelling x 3 days, denies drainage, known injury.

## 2024-03-07 NOTE — ED Provider Notes (Signed)
 [place] EMERGENCY DEPARTMENT AT [facility] [place] Provider Note   CSN: [id number] Arrival date & time: 03/07/24  1732     Patient presents with: Toe Pain   Andrew David is a 28 y.o. male.   28 year old male presents with complaint of left great toe pain with swelling for the past 4 days.  No injury, no drainage.       Prior to Admission medications   Medication Sig Start Date End Date Taking? Authorizing Provider  cephALEXin  (KEFLEX ) 500 MG capsule Take 1 capsule (500 mg total) by mouth 3 (three) times daily for 7 days. 03/07/24 03/14/24 Yes Beverley Leita LABOR, PA-C    Allergies: Patient has no known allergies.    Review of Systems Negative except as per HPI Updated Vital Signs BP 125/84   Pulse 94   Temp 98.4 F (36.9 C)   Resp 17   Ht 5' 7 (1.702 m)   SpO2 99%   BMI 36.02 kg/m   Physical Exam Vitals and nursing note reviewed.  Constitutional:      General: He is not in acute distress.    Appearance: He is well-developed. He is not diaphoretic.  HENT:     Head: Normocephalic and atraumatic.  Cardiovascular:     Pulses: Normal pulses.  Pulmonary:     Effort: Pulmonary effort is normal.  Musculoskeletal:        General: Swelling and tenderness present.       Feet:  Skin:    General: Skin is warm and dry.  Neurological:     Mental Status: He is alert and oriented to person, place, and time.  Psychiatric:        Behavior: Behavior normal.     (all labs ordered are listed, but only abnormal results are displayed) Labs Reviewed - No data to display  EKG: None  Radiology: No results found.   .Incision and Drainage  Date/Time: 03/07/2024 7:53 PM  Performed by: Beverley Leita LABOR, PA-C Authorized by: Beverley Leita LABOR, PA-C   Consent:    Consent obtained:  Verbal   Consent given by:  Patient   Risks discussed:  Bleeding, incomplete drainage, pain and damage to other organs   Alternatives discussed:  No treatment Universal protocol:    Procedure  explained and questions answered to patient or proxy's satisfaction: yes     Relevant documents present and verified: yes     Test results available : yes     Imaging studies available: yes     Required blood products, implants, devices, and special equipment available: yes     Site/side marked: yes     Immediately prior to procedure, a time out was called: yes     Patient identity confirmed:  Verbally with patient Location:    Type:  Abscess   Size:  43mmx3mm   Location:  Lower extremity   Lower extremity location:  Toe   Toe location:  L big toe Pre-procedure details:    Skin preparation:  Betadine Sedation:    Sedation type:  None Anesthesia:    Anesthesia method:  Local infiltration   Local anesthetic:  Lidocaine  1% w/o epi Procedure type:    Complexity:  Simple Procedure details:    Incision types:  Single straight   Incision depth:  Subcutaneous   Drainage:  Purulent   Drainage amount:  Scant   Packing materials:  None Post-procedure details:    Procedure completion:  Tolerated well, no immediate complications  Medications Ordered in the ED  lidocaine  (PF) (XYLOCAINE ) 1 % injection 5 mL (5 mLs Infiltration Given 03/07/24 1954)  cephALEXin  (KEFLEX ) capsule 500 mg (500 mg Oral Given 03/07/24 1956)                                    Medical Decision Making Risk Prescription drug management.   28 year old male with concern for ingrown left great toenail, likely paronychia.  Plan is for I&D, will provide oral antibiotics and refer to podiatry for follow-up.  Recommend warm Epsom salt compress/soak.     Final diagnoses:  Paronychia of toe of left foot    ED Discharge Orders          Ordered    cephALEXin  (KEFLEX ) 500 MG capsule  3 times daily        03/07/24 1950               Beverley Leita DELENA DEVONNA 03/07/24 2008    Geraldene Hamilton, MD 03/08/24 1806
